# Patient Record
Sex: Female | Born: 1950 | Race: White | Hispanic: No | Smoking: Never smoker
Health system: Southern US, Community
[De-identification: ages and names within clinical notes are randomized; demographics above are authoritative.]

## PROBLEM LIST (undated history)

## (undated) DIAGNOSIS — E785 Hyperlipidemia, unspecified: Secondary | ICD-10-CM

## (undated) DIAGNOSIS — I10 Essential (primary) hypertension: Secondary | ICD-10-CM

## (undated) DIAGNOSIS — E079 Disorder of thyroid, unspecified: Secondary | ICD-10-CM

## (undated) DIAGNOSIS — T7840XA Allergy, unspecified, initial encounter: Secondary | ICD-10-CM

## (undated) DIAGNOSIS — R112 Nausea with vomiting, unspecified: Secondary | ICD-10-CM

## (undated) DIAGNOSIS — K219 Gastro-esophageal reflux disease without esophagitis: Secondary | ICD-10-CM

## (undated) DIAGNOSIS — Z9889 Other specified postprocedural states: Secondary | ICD-10-CM

## (undated) DIAGNOSIS — K589 Irritable bowel syndrome without diarrhea: Secondary | ICD-10-CM

## (undated) DIAGNOSIS — G4730 Sleep apnea, unspecified: Secondary | ICD-10-CM

## (undated) DIAGNOSIS — C801 Malignant (primary) neoplasm, unspecified: Secondary | ICD-10-CM

## (undated) DIAGNOSIS — H269 Unspecified cataract: Secondary | ICD-10-CM

## (undated) DIAGNOSIS — K5730 Diverticulosis of large intestine without perforation or abscess without bleeding: Secondary | ICD-10-CM

## (undated) HISTORY — DX: Irritable bowel syndrome, unspecified: K58.9

## (undated) HISTORY — PX: UPPER GASTROINTESTINAL ENDOSCOPY: SHX188

## (undated) HISTORY — DX: Hyperlipidemia, unspecified: E78.5

## (undated) HISTORY — DX: Malignant (primary) neoplasm, unspecified: C80.1

## (undated) HISTORY — PX: CHOLECYSTECTOMY: SHX55

## (undated) HISTORY — PX: VAGINAL HYSTERECTOMY: SUR661

## (undated) HISTORY — DX: Nausea with vomiting, unspecified: Z98.890

## (undated) HISTORY — DX: Sleep apnea, unspecified: G47.30

## (undated) HISTORY — PX: POLYPECTOMY: SHX149

## (undated) HISTORY — DX: Diverticulosis of large intestine without perforation or abscess without bleeding: K57.30

## (undated) HISTORY — DX: Gastro-esophageal reflux disease without esophagitis: K21.9

## (undated) HISTORY — DX: Unspecified cataract: H26.9

## (undated) HISTORY — PX: COLONOSCOPY: SHX174

## (undated) HISTORY — DX: Disorder of thyroid, unspecified: E07.9

## (undated) HISTORY — DX: Allergy, unspecified, initial encounter: T78.40XA

## (undated) HISTORY — DX: Essential (primary) hypertension: I10

## (undated) HISTORY — DX: Other specified postprocedural states: R11.2

---

## 2015-11-28 DIAGNOSIS — G8929 Other chronic pain: Secondary | ICD-10-CM | POA: Diagnosis not present

## 2015-11-28 DIAGNOSIS — H6120 Impacted cerumen, unspecified ear: Secondary | ICD-10-CM | POA: Diagnosis not present

## 2015-11-28 DIAGNOSIS — M25512 Pain in left shoulder: Secondary | ICD-10-CM | POA: Diagnosis not present

## 2016-03-09 DIAGNOSIS — Z79899 Other long term (current) drug therapy: Secondary | ICD-10-CM | POA: Diagnosis not present

## 2016-03-09 DIAGNOSIS — Z23 Encounter for immunization: Secondary | ICD-10-CM | POA: Diagnosis not present

## 2016-03-09 DIAGNOSIS — E782 Mixed hyperlipidemia: Secondary | ICD-10-CM | POA: Diagnosis not present

## 2016-03-09 DIAGNOSIS — Z0000 Encounter for general adult medical examination without abnormal findings: Secondary | ICD-10-CM | POA: Diagnosis not present

## 2016-03-16 DIAGNOSIS — Z1211 Encounter for screening for malignant neoplasm of colon: Secondary | ICD-10-CM | POA: Diagnosis not present

## 2016-03-19 DIAGNOSIS — R748 Abnormal levels of other serum enzymes: Secondary | ICD-10-CM | POA: Diagnosis not present

## 2016-03-19 DIAGNOSIS — Z1231 Encounter for screening mammogram for malignant neoplasm of breast: Secondary | ICD-10-CM | POA: Diagnosis not present

## 2016-03-25 DIAGNOSIS — R748 Abnormal levels of other serum enzymes: Secondary | ICD-10-CM | POA: Diagnosis not present

## 2016-03-26 ENCOUNTER — Other Ambulatory Visit: Payer: Self-pay | Admitting: Family Medicine

## 2016-03-26 DIAGNOSIS — R928 Other abnormal and inconclusive findings on diagnostic imaging of breast: Secondary | ICD-10-CM | POA: Diagnosis not present

## 2016-03-26 DIAGNOSIS — N6489 Other specified disorders of breast: Secondary | ICD-10-CM

## 2016-03-27 DIAGNOSIS — G4733 Obstructive sleep apnea (adult) (pediatric): Secondary | ICD-10-CM | POA: Diagnosis not present

## 2016-04-02 ENCOUNTER — Other Ambulatory Visit: Payer: Self-pay | Admitting: Family Medicine

## 2016-04-02 DIAGNOSIS — N6489 Other specified disorders of breast: Secondary | ICD-10-CM

## 2016-04-03 ENCOUNTER — Ambulatory Visit
Admission: RE | Admit: 2016-04-03 | Discharge: 2016-04-03 | Disposition: A | Discharge status: Home / Self Care | Payer: PPO | Source: Ambulatory Visit | Attending: Family Medicine | Admitting: Family Medicine

## 2016-04-03 DIAGNOSIS — N6489 Other specified disorders of breast: Secondary | ICD-10-CM

## 2016-04-03 DIAGNOSIS — R922 Inconclusive mammogram: Secondary | ICD-10-CM | POA: Diagnosis not present

## 2016-07-30 DIAGNOSIS — Z23 Encounter for immunization: Secondary | ICD-10-CM | POA: Diagnosis not present

## 2016-09-03 DIAGNOSIS — H25811 Combined forms of age-related cataract, right eye: Secondary | ICD-10-CM | POA: Diagnosis not present

## 2016-09-03 DIAGNOSIS — H524 Presbyopia: Secondary | ICD-10-CM | POA: Diagnosis not present

## 2016-09-24 DIAGNOSIS — Z1389 Encounter for screening for other disorder: Secondary | ICD-10-CM | POA: Diagnosis not present

## 2016-09-24 DIAGNOSIS — Z9181 History of falling: Secondary | ICD-10-CM | POA: Diagnosis not present

## 2016-09-24 DIAGNOSIS — I10 Essential (primary) hypertension: Secondary | ICD-10-CM | POA: Diagnosis not present

## 2016-10-23 DIAGNOSIS — I10 Essential (primary) hypertension: Secondary | ICD-10-CM | POA: Diagnosis not present

## 2016-12-15 DIAGNOSIS — H25041 Posterior subcapsular polar age-related cataract, right eye: Secondary | ICD-10-CM | POA: Diagnosis not present

## 2016-12-15 DIAGNOSIS — H2511 Age-related nuclear cataract, right eye: Secondary | ICD-10-CM | POA: Diagnosis not present

## 2016-12-15 DIAGNOSIS — H2513 Age-related nuclear cataract, bilateral: Secondary | ICD-10-CM | POA: Diagnosis not present

## 2016-12-15 DIAGNOSIS — H25012 Cortical age-related cataract, left eye: Secondary | ICD-10-CM | POA: Diagnosis not present

## 2016-12-15 DIAGNOSIS — H25011 Cortical age-related cataract, right eye: Secondary | ICD-10-CM | POA: Diagnosis not present

## 2017-02-15 DIAGNOSIS — H25811 Combined forms of age-related cataract, right eye: Secondary | ICD-10-CM | POA: Diagnosis not present

## 2017-02-15 DIAGNOSIS — H2511 Age-related nuclear cataract, right eye: Secondary | ICD-10-CM | POA: Diagnosis not present

## 2017-02-16 DIAGNOSIS — H2512 Age-related nuclear cataract, left eye: Secondary | ICD-10-CM | POA: Diagnosis not present

## 2017-02-23 ENCOUNTER — Other Ambulatory Visit: Payer: Self-pay | Admitting: Family Medicine

## 2017-02-23 DIAGNOSIS — Z1231 Encounter for screening mammogram for malignant neoplasm of breast: Secondary | ICD-10-CM

## 2017-03-08 DIAGNOSIS — H25812 Combined forms of age-related cataract, left eye: Secondary | ICD-10-CM | POA: Diagnosis not present

## 2017-03-08 DIAGNOSIS — H2512 Age-related nuclear cataract, left eye: Secondary | ICD-10-CM | POA: Diagnosis not present

## 2017-03-11 DIAGNOSIS — Z6823 Body mass index (BMI) 23.0-23.9, adult: Secondary | ICD-10-CM | POA: Diagnosis not present

## 2017-03-11 DIAGNOSIS — I10 Essential (primary) hypertension: Secondary | ICD-10-CM | POA: Diagnosis not present

## 2017-03-11 DIAGNOSIS — Z23 Encounter for immunization: Secondary | ICD-10-CM | POA: Diagnosis not present

## 2017-03-11 DIAGNOSIS — E782 Mixed hyperlipidemia: Secondary | ICD-10-CM | POA: Diagnosis not present

## 2017-03-11 DIAGNOSIS — Z0000 Encounter for general adult medical examination without abnormal findings: Secondary | ICD-10-CM | POA: Diagnosis not present

## 2017-03-11 DIAGNOSIS — S21209A Unspecified open wound of unspecified back wall of thorax without penetration into thoracic cavity, initial encounter: Secondary | ICD-10-CM | POA: Diagnosis not present

## 2017-03-11 DIAGNOSIS — Z79899 Other long term (current) drug therapy: Secondary | ICD-10-CM | POA: Diagnosis not present

## 2017-03-11 DIAGNOSIS — R748 Abnormal levels of other serum enzymes: Secondary | ICD-10-CM | POA: Diagnosis not present

## 2017-03-11 DIAGNOSIS — E039 Hypothyroidism, unspecified: Secondary | ICD-10-CM | POA: Diagnosis not present

## 2017-03-11 DIAGNOSIS — Z760 Encounter for issue of repeat prescription: Secondary | ICD-10-CM | POA: Diagnosis not present

## 2017-03-12 DIAGNOSIS — Z79899 Other long term (current) drug therapy: Secondary | ICD-10-CM | POA: Diagnosis not present

## 2017-03-24 DIAGNOSIS — Z1211 Encounter for screening for malignant neoplasm of colon: Secondary | ICD-10-CM | POA: Diagnosis not present

## 2017-03-26 ENCOUNTER — Ambulatory Visit
Admission: RE | Admit: 2017-03-26 | Discharge: 2017-03-26 | Disposition: A | Discharge status: Home / Self Care | Payer: PPO | Source: Ambulatory Visit | Attending: Family Medicine | Admitting: Family Medicine

## 2017-03-26 DIAGNOSIS — Z1231 Encounter for screening mammogram for malignant neoplasm of breast: Secondary | ICD-10-CM

## 2017-04-06 DIAGNOSIS — Z961 Presence of intraocular lens: Secondary | ICD-10-CM | POA: Diagnosis not present

## 2017-07-15 DIAGNOSIS — Z23 Encounter for immunization: Secondary | ICD-10-CM | POA: Diagnosis not present

## 2017-07-29 DIAGNOSIS — L570 Actinic keratosis: Secondary | ICD-10-CM | POA: Diagnosis not present

## 2017-08-24 DIAGNOSIS — M272 Inflammatory conditions of jaws: Secondary | ICD-10-CM | POA: Diagnosis not present

## 2017-08-24 DIAGNOSIS — K045 Chronic apical periodontitis: Secondary | ICD-10-CM | POA: Diagnosis not present

## 2017-09-14 DIAGNOSIS — K045 Chronic apical periodontitis: Secondary | ICD-10-CM | POA: Diagnosis not present

## 2017-09-14 DIAGNOSIS — M272 Inflammatory conditions of jaws: Secondary | ICD-10-CM | POA: Diagnosis not present

## 2017-10-12 DIAGNOSIS — K529 Noninfective gastroenteritis and colitis, unspecified: Secondary | ICD-10-CM | POA: Diagnosis not present

## 2017-10-12 DIAGNOSIS — Z6824 Body mass index (BMI) 24.0-24.9, adult: Secondary | ICD-10-CM | POA: Diagnosis not present

## 2017-10-13 DIAGNOSIS — K529 Noninfective gastroenteritis and colitis, unspecified: Secondary | ICD-10-CM | POA: Diagnosis not present

## 2018-01-27 DIAGNOSIS — L719 Rosacea, unspecified: Secondary | ICD-10-CM | POA: Diagnosis not present

## 2018-01-27 DIAGNOSIS — Z6824 Body mass index (BMI) 24.0-24.9, adult: Secondary | ICD-10-CM | POA: Diagnosis not present

## 2018-02-17 ENCOUNTER — Other Ambulatory Visit: Payer: Self-pay | Admitting: Family Medicine

## 2018-02-17 DIAGNOSIS — Z1231 Encounter for screening mammogram for malignant neoplasm of breast: Secondary | ICD-10-CM

## 2018-03-17 DIAGNOSIS — E039 Hypothyroidism, unspecified: Secondary | ICD-10-CM | POA: Diagnosis not present

## 2018-03-17 DIAGNOSIS — Z1331 Encounter for screening for depression: Secondary | ICD-10-CM | POA: Diagnosis not present

## 2018-03-17 DIAGNOSIS — Z9181 History of falling: Secondary | ICD-10-CM | POA: Diagnosis not present

## 2018-03-17 DIAGNOSIS — Z0000 Encounter for general adult medical examination without abnormal findings: Secondary | ICD-10-CM | POA: Diagnosis not present

## 2018-03-17 DIAGNOSIS — Z13220 Encounter for screening for lipoid disorders: Secondary | ICD-10-CM | POA: Diagnosis not present

## 2018-03-17 DIAGNOSIS — Z6824 Body mass index (BMI) 24.0-24.9, adult: Secondary | ICD-10-CM | POA: Diagnosis not present

## 2018-03-17 DIAGNOSIS — G4733 Obstructive sleep apnea (adult) (pediatric): Secondary | ICD-10-CM | POA: Diagnosis not present

## 2018-03-31 ENCOUNTER — Ambulatory Visit
Admission: RE | Admit: 2018-03-31 | Discharge: 2018-03-31 | Disposition: A | Discharge status: Home / Self Care | Payer: PPO | Source: Ambulatory Visit | Attending: Family Medicine | Admitting: Family Medicine

## 2018-03-31 DIAGNOSIS — Z1231 Encounter for screening mammogram for malignant neoplasm of breast: Secondary | ICD-10-CM | POA: Diagnosis not present

## 2018-04-29 DIAGNOSIS — G4733 Obstructive sleep apnea (adult) (pediatric): Secondary | ICD-10-CM | POA: Diagnosis not present

## 2018-05-30 DIAGNOSIS — G4733 Obstructive sleep apnea (adult) (pediatric): Secondary | ICD-10-CM | POA: Diagnosis not present

## 2018-06-30 DIAGNOSIS — G4733 Obstructive sleep apnea (adult) (pediatric): Secondary | ICD-10-CM | POA: Diagnosis not present

## 2018-07-14 DIAGNOSIS — Z23 Encounter for immunization: Secondary | ICD-10-CM | POA: Diagnosis not present

## 2018-07-21 DIAGNOSIS — Z6824 Body mass index (BMI) 24.0-24.9, adult: Secondary | ICD-10-CM | POA: Diagnosis not present

## 2018-07-21 DIAGNOSIS — M25552 Pain in left hip: Secondary | ICD-10-CM | POA: Diagnosis not present

## 2018-07-21 DIAGNOSIS — Z1339 Encounter for screening examination for other mental health and behavioral disorders: Secondary | ICD-10-CM | POA: Diagnosis not present

## 2018-07-30 DIAGNOSIS — G4733 Obstructive sleep apnea (adult) (pediatric): Secondary | ICD-10-CM | POA: Diagnosis not present

## 2018-08-10 DIAGNOSIS — H6120 Impacted cerumen, unspecified ear: Secondary | ICD-10-CM | POA: Diagnosis not present

## 2018-08-10 DIAGNOSIS — J019 Acute sinusitis, unspecified: Secondary | ICD-10-CM | POA: Diagnosis not present

## 2018-08-30 DIAGNOSIS — G4733 Obstructive sleep apnea (adult) (pediatric): Secondary | ICD-10-CM | POA: Diagnosis not present

## 2018-10-30 DIAGNOSIS — G4733 Obstructive sleep apnea (adult) (pediatric): Secondary | ICD-10-CM | POA: Diagnosis not present

## 2018-11-30 DIAGNOSIS — G4733 Obstructive sleep apnea (adult) (pediatric): Secondary | ICD-10-CM | POA: Diagnosis not present

## 2018-12-29 DIAGNOSIS — G4733 Obstructive sleep apnea (adult) (pediatric): Secondary | ICD-10-CM | POA: Diagnosis not present

## 2019-01-29 DIAGNOSIS — G4733 Obstructive sleep apnea (adult) (pediatric): Secondary | ICD-10-CM | POA: Diagnosis not present

## 2019-02-28 ENCOUNTER — Other Ambulatory Visit: Payer: Self-pay | Admitting: Family Medicine

## 2019-02-28 DIAGNOSIS — Z1231 Encounter for screening mammogram for malignant neoplasm of breast: Secondary | ICD-10-CM

## 2019-02-28 DIAGNOSIS — G4733 Obstructive sleep apnea (adult) (pediatric): Secondary | ICD-10-CM | POA: Diagnosis not present

## 2019-03-23 DIAGNOSIS — Z79899 Other long term (current) drug therapy: Secondary | ICD-10-CM | POA: Diagnosis not present

## 2019-03-23 DIAGNOSIS — Z9181 History of falling: Secondary | ICD-10-CM | POA: Diagnosis not present

## 2019-03-23 DIAGNOSIS — E039 Hypothyroidism, unspecified: Secondary | ICD-10-CM | POA: Diagnosis not present

## 2019-03-23 DIAGNOSIS — Z6824 Body mass index (BMI) 24.0-24.9, adult: Secondary | ICD-10-CM | POA: Diagnosis not present

## 2019-03-23 DIAGNOSIS — Z0000 Encounter for general adult medical examination without abnormal findings: Secondary | ICD-10-CM | POA: Diagnosis not present

## 2019-03-23 DIAGNOSIS — Z1331 Encounter for screening for depression: Secondary | ICD-10-CM | POA: Diagnosis not present

## 2019-03-23 DIAGNOSIS — K589 Irritable bowel syndrome without diarrhea: Secondary | ICD-10-CM | POA: Diagnosis not present

## 2019-03-23 DIAGNOSIS — E782 Mixed hyperlipidemia: Secondary | ICD-10-CM | POA: Diagnosis not present

## 2019-03-23 DIAGNOSIS — I10 Essential (primary) hypertension: Secondary | ICD-10-CM | POA: Diagnosis not present

## 2019-03-31 DIAGNOSIS — G4733 Obstructive sleep apnea (adult) (pediatric): Secondary | ICD-10-CM | POA: Diagnosis not present

## 2019-04-27 ENCOUNTER — Other Ambulatory Visit: Payer: Self-pay

## 2019-04-27 ENCOUNTER — Ambulatory Visit
Admission: RE | Admit: 2019-04-27 | Discharge: 2019-04-27 | Disposition: A | Discharge status: Home / Self Care | Payer: PPO | Source: Ambulatory Visit | Attending: Family Medicine | Admitting: Family Medicine

## 2019-04-27 DIAGNOSIS — Z1231 Encounter for screening mammogram for malignant neoplasm of breast: Secondary | ICD-10-CM

## 2019-04-30 DIAGNOSIS — G4733 Obstructive sleep apnea (adult) (pediatric): Secondary | ICD-10-CM | POA: Diagnosis not present

## 2019-05-31 DIAGNOSIS — G4733 Obstructive sleep apnea (adult) (pediatric): Secondary | ICD-10-CM | POA: Diagnosis not present

## 2019-07-01 DIAGNOSIS — G4733 Obstructive sleep apnea (adult) (pediatric): Secondary | ICD-10-CM | POA: Diagnosis not present

## 2019-07-07 DIAGNOSIS — I10 Essential (primary) hypertension: Secondary | ICD-10-CM | POA: Diagnosis not present

## 2019-07-07 DIAGNOSIS — Z6824 Body mass index (BMI) 24.0-24.9, adult: Secondary | ICD-10-CM | POA: Diagnosis not present

## 2019-07-07 DIAGNOSIS — C4492 Squamous cell carcinoma of skin, unspecified: Secondary | ICD-10-CM | POA: Diagnosis not present

## 2019-07-07 DIAGNOSIS — L089 Local infection of the skin and subcutaneous tissue, unspecified: Secondary | ICD-10-CM | POA: Diagnosis not present

## 2019-07-13 DIAGNOSIS — L578 Other skin changes due to chronic exposure to nonionizing radiation: Secondary | ICD-10-CM | POA: Diagnosis not present

## 2019-07-13 DIAGNOSIS — C44722 Squamous cell carcinoma of skin of right lower limb, including hip: Secondary | ICD-10-CM | POA: Diagnosis not present

## 2019-07-19 DIAGNOSIS — C44722 Squamous cell carcinoma of skin of right lower limb, including hip: Secondary | ICD-10-CM | POA: Diagnosis not present

## 2019-07-31 DIAGNOSIS — G4733 Obstructive sleep apnea (adult) (pediatric): Secondary | ICD-10-CM | POA: Diagnosis not present

## 2019-08-28 ENCOUNTER — Encounter: Payer: Self-pay | Admitting: Gastroenterology

## 2019-12-04 ENCOUNTER — Encounter: Payer: Self-pay | Admitting: Gastroenterology

## 2019-12-15 ENCOUNTER — Ambulatory Visit (AMBULATORY_SURGERY_CENTER): Payer: PPO | Admitting: *Deleted

## 2019-12-15 ENCOUNTER — Other Ambulatory Visit: Payer: Self-pay

## 2019-12-15 VITALS — Ht 62.0 in | Wt 116.0 lb

## 2019-12-15 DIAGNOSIS — Z86010 Personal history of colonic polyps: Secondary | ICD-10-CM

## 2019-12-15 NOTE — Progress Notes (Signed)
Pt verified name, DOB, address and insurance during PV today. Pt mailed instruction packet to included paper to complete and mail back to [facility] with addressed and stamped envelope, Emmi video, copy of consent form to read and not return, and instructions. PV completed over the phone. Pt encouraged to call with questions or issues  Pt has had 2 covid vaccines- last 2-6 so will be >2 weeks out for colon- no pre cov screen needed   No egg or soy allergy known to patient   issues with past sedation with any surgeries  or procedures of PONV , no intubation problems  No diet pills per patient No home 02 use per patient  No blood thinners per patient  Pt denies issues with constipation  No A fib or A flutter  EMMI video sent to pt's e mail   Due to the COVID-19 pandemic we are asking patients to follow these guidelines. Please only bring one care partner. Please be aware that your care partner may wait in the car in the parking lot or if they feel like they will be too hot to wait in the car, they may wait in the lobby on the 4th floor. All care partners are required to wear a mask the entire time (we do not have any that we can provide them), they need to practice social distancing, and we will do a Covid check for all patient's and care partners when you arrive. Also we will check their temperature and your temperature. If the care partner waits in their car they need to stay in the parking lot the entire time and we will call them on their cell phone when the patient is ready for discharge so they can bring the car to the front of the building. Also all patient's will need to wear a mask into building.

## 2019-12-25 ENCOUNTER — Encounter: Payer: Self-pay | Admitting: Gastroenterology

## 2019-12-28 ENCOUNTER — Ambulatory Visit (AMBULATORY_SURGERY_CENTER): Payer: PPO | Admitting: Gastroenterology

## 2019-12-28 ENCOUNTER — Encounter: Payer: Self-pay | Admitting: Gastroenterology

## 2019-12-28 ENCOUNTER — Other Ambulatory Visit: Payer: Self-pay

## 2019-12-28 VITALS — BP 116/64 | HR 69 | Temp 96.8°F | Resp 16 | Ht 62.0 in | Wt 116.0 lb

## 2019-12-28 DIAGNOSIS — K635 Polyp of colon: Secondary | ICD-10-CM

## 2019-12-28 DIAGNOSIS — D122 Benign neoplasm of ascending colon: Secondary | ICD-10-CM

## 2019-12-28 DIAGNOSIS — Z86010 Personal history of colonic polyps: Secondary | ICD-10-CM | POA: Diagnosis not present

## 2019-12-28 MED ORDER — SODIUM CHLORIDE 0.9 % IV SOLN: 500.0000 mL | Freq: Once | INTRAVENOUS | Status: DC

## 2019-12-28 NOTE — Op Note (Signed)
[place] Patient Name: Terrell Wobig Procedure Date: 12/28/2019 9:18 AM MRN: [id number] Endoscopist: Jackquline Denmark , MD Age: 69 Referring MD:  Date of Birth: Feb 19, 1951 Gender: Female Account #: [id number] Procedure:                Colonoscopy Indications:              High risk colon cancer surveillance: Personal                            history of colonic polyps Medicines:                Monitored Anesthesia Care Procedure:                Pre-Anesthesia Assessment:                           - Prior to the procedure, a History and Physical                            was performed, and patient medications and                            allergies were reviewed. The patient's tolerance of                            previous anesthesia was also reviewed. The risks                            and benefits of the procedure and the sedation                            options and risks were discussed with the patient.                            All questions were answered, and informed consent                            was obtained. Prior Anticoagulants: The patient has                            taken no previous anticoagulant or antiplatelet                            agents. ASA Grade Assessment: II - A patient with                            mild systemic disease. After reviewing the risks                            and benefits, the patient was deemed in                            satisfactory condition to undergo the procedure.  After obtaining informed consent, the colonoscope                            was passed under direct vision. Throughout the                            procedure, the patient's blood pressure, pulse, and                            oxygen saturations were monitored continuously. The                            Colonoscope was introduced through the anus and                            advanced to the 2 cm into the ileum. The                             colonoscopy was performed without difficulty. The                            patient tolerated the procedure well. The quality                            of the bowel preparation was good. The terminal                            ileum, ileocecal valve, appendiceal orifice, and                            rectum were photographed. Scope In: 9:23:28 AM Scope Out: 9:37:51 AM Scope Withdrawal Time: 0 hours 10 minutes 28 seconds  Total Procedure Duration: 0 hours 14 minutes 23 seconds  Findings:                 A 6 mm polyp was found in the proximal ascending                            colon. The polyp was sessile. The polyp was removed                            with a cold snare. Resection and retrieval were                            complete.                           Multiple small-mouthed diverticula were found in                            the sigmoid colon, descending colon and few rare in                            ascending colon.  Non-bleeding internal hemorrhoids and ext                            hemorrhoids were found during retroflexion and                            rectal exam. The hemorrhoids were moderate.                           The terminal ileum appeared normal.                           The exam was otherwise without abnormality on                            direct and retroflexion views. Complications:            No immediate complications. Estimated Blood Loss:     Estimated blood loss: none. Impression:               - One 6 mm polyp in the proximal ascending colon,                            removed with a cold snare. Resected and retrieved.                           - Moderate predominantly sigmoid diverticulosis.                           - Non-bleeding internal hemorrhoids.                           - Otherwise normal colonoscopy to TI. Recommendation:           - Patient has a contact number available for                             emergencies. The signs and symptoms of potential                            delayed complications were discussed with the                            patient. Return to normal activities tomorrow.                            Written discharge instructions were provided to the                            patient.                           - High fiber diet.                           - Continue present medications.                           -  Await pathology results.                           - Repeat colonoscopy in 7-10 years for surveillance                            based on pathology results.                           - Return to GI office PRN.                           - D/W Florina Ou, MD 12/28/2019 9:46:11 AM This report has been signed electronically.

## 2019-12-28 NOTE — Progress Notes (Signed)
Called to room to assist during endoscopic procedure.  Patient ID and intended procedure confirmed with present staff. Received instructions for my participation in the procedure from the performing physician.  

## 2019-12-28 NOTE — Patient Instructions (Signed)
 Handouts on polyps ,diverticulosis ,hemorrhoids, high fiber diet  given to you today  Await pathology results on polyp removed     YOU HAD AN ENDOSCOPIC PROCEDURE TODAY AT THE [facility]:   Refer to the procedure report that was given to you for any specific questions about what was found during the examination.  If the procedure report does not answer your questions, please call your gastroenterologist to clarify.  If you requested that your care partner not be given the details of your procedure findings, then the procedure report has been included in a sealed envelope for you to review at your convenience later.  YOU SHOULD EXPECT: Some feelings of bloating in the abdomen. Passage of more gas than usual.  Walking can help get rid of the air that was put into your GI tract during the procedure and reduce the bloating. If you had a lower endoscopy (such as a colonoscopy or flexible sigmoidoscopy) you may notice spotting of blood in your stool or on the toilet paper. If you underwent a bowel prep for your procedure, you may not have a normal bowel movement for a few days.  Please Note:  You might notice some irritation and congestion in your nose or some drainage.  This is from the oxygen used during your procedure.  There is no need for concern and it should clear up in a day or so.  SYMPTOMS TO REPORT IMMEDIATELY:   Following lower endoscopy (colonoscopy or flexible sigmoidoscopy):  Excessive amounts of blood in the stool  Significant tenderness or worsening of abdominal pains  Swelling of the abdomen that is new, acute  Fever of 100F or higher    For urgent or emergent issues, a gastroenterologist can be reached at any hour by calling [phone]. Do not use MyChart messaging for urgent concerns.    DIET:  We do recommend a small meal at first, but then you may proceed to your regular diet.  Drink plenty of fluids but you should avoid alcoholic beverages for 24  hours.  ACTIVITY:  You should plan to take it easy for the rest of today and you should NOT DRIVE or use heavy machinery until tomorrow (because of the sedation medicines used during the test).    FOLLOW UP: Our staff will call the number listed on your records 48-72 hours following your procedure to check on you and address any questions or concerns that you may have regarding the information given to you following your procedure. If we do not reach you, we will leave a message.  We will attempt to reach you two times.  During this call, we will ask if you have developed any symptoms of COVID 19. If you develop any symptoms (ie: fever, flu-like symptoms, shortness of breath, cough etc.) before then, please call [phone].  If you test positive for Covid 19 in the 2 weeks post procedure, please call and report this information to us.    If any biopsies were taken you will be contacted by phone or by letter within the next 1-3 weeks.  Please call us at [phone] if you have not heard about the biopsies in 3 weeks.    SIGNATURES/CONFIDENTIALITY: You and/or your care partner have signed paperwork which will be entered into your electronic medical record.  These signatures attest to the fact that that the information above on your After Visit Summary has been reviewed and is understood.  Full responsibility of the confidentiality of this   with you and/or your care-partner.

## 2019-12-28 NOTE — Progress Notes (Signed)
PT taken to PACU. Monitors in place. VSS. Report given to RN. 

## 2019-12-28 NOTE — Progress Notes (Signed)
Pt's states no medical or surgical changes since previsit or office visit.  Temp-JB  V/S-KA

## 2020-01-01 ENCOUNTER — Telehealth: Payer: Self-pay

## 2020-01-01 ENCOUNTER — Telehealth: Payer: Self-pay | Admitting: *Deleted

## 2020-01-01 NOTE — Telephone Encounter (Signed)
No answer for post procedure call back. Left message for patient to call back if needed and will attempt second call this afternoon.

## 2020-01-01 NOTE — Telephone Encounter (Signed)
2nd follow up call made.  NALM 

## 2020-01-04 DIAGNOSIS — Z131 Encounter for screening for diabetes mellitus: Secondary | ICD-10-CM | POA: Diagnosis not present

## 2020-01-04 DIAGNOSIS — Z79899 Other long term (current) drug therapy: Secondary | ICD-10-CM | POA: Diagnosis not present

## 2020-01-04 DIAGNOSIS — K589 Irritable bowel syndrome without diarrhea: Secondary | ICD-10-CM | POA: Diagnosis not present

## 2020-01-04 DIAGNOSIS — E039 Hypothyroidism, unspecified: Secondary | ICD-10-CM | POA: Diagnosis not present

## 2020-01-04 DIAGNOSIS — E782 Mixed hyperlipidemia: Secondary | ICD-10-CM | POA: Diagnosis not present

## 2020-01-04 DIAGNOSIS — M542 Cervicalgia: Secondary | ICD-10-CM | POA: Diagnosis not present

## 2020-01-04 DIAGNOSIS — Z6820 Body mass index (BMI) 20.0-20.9, adult: Secondary | ICD-10-CM | POA: Diagnosis not present

## 2020-01-07 ENCOUNTER — Encounter: Payer: Self-pay | Admitting: Gastroenterology

## 2020-03-11 DIAGNOSIS — B0239 Other herpes zoster eye disease: Secondary | ICD-10-CM | POA: Diagnosis not present

## 2020-03-11 DIAGNOSIS — Z6820 Body mass index (BMI) 20.0-20.9, adult: Secondary | ICD-10-CM | POA: Diagnosis not present

## 2020-03-18 ENCOUNTER — Other Ambulatory Visit: Payer: Self-pay | Admitting: Family Medicine

## 2020-03-18 DIAGNOSIS — Z1231 Encounter for screening mammogram for malignant neoplasm of breast: Secondary | ICD-10-CM

## 2020-03-26 DIAGNOSIS — Z6821 Body mass index (BMI) 21.0-21.9, adult: Secondary | ICD-10-CM | POA: Diagnosis not present

## 2020-03-26 DIAGNOSIS — H00019 Hordeolum externum unspecified eye, unspecified eyelid: Secondary | ICD-10-CM | POA: Diagnosis not present

## 2020-03-26 DIAGNOSIS — Z1331 Encounter for screening for depression: Secondary | ICD-10-CM | POA: Diagnosis not present

## 2020-03-26 DIAGNOSIS — H029 Unspecified disorder of eyelid: Secondary | ICD-10-CM | POA: Diagnosis not present

## 2020-03-26 DIAGNOSIS — Z9181 History of falling: Secondary | ICD-10-CM | POA: Diagnosis not present

## 2020-03-26 DIAGNOSIS — H8113 Benign paroxysmal vertigo, bilateral: Secondary | ICD-10-CM | POA: Diagnosis not present

## 2020-03-28 DIAGNOSIS — H00012 Hordeolum externum right lower eyelid: Secondary | ICD-10-CM | POA: Diagnosis not present

## 2020-04-11 DIAGNOSIS — K589 Irritable bowel syndrome without diarrhea: Secondary | ICD-10-CM | POA: Diagnosis not present

## 2020-04-11 DIAGNOSIS — E782 Mixed hyperlipidemia: Secondary | ICD-10-CM | POA: Diagnosis not present

## 2020-04-11 DIAGNOSIS — I10 Essential (primary) hypertension: Secondary | ICD-10-CM | POA: Diagnosis not present

## 2020-04-11 DIAGNOSIS — Z0000 Encounter for general adult medical examination without abnormal findings: Secondary | ICD-10-CM | POA: Diagnosis not present

## 2020-04-11 DIAGNOSIS — E039 Hypothyroidism, unspecified: Secondary | ICD-10-CM | POA: Diagnosis not present

## 2020-05-03 ENCOUNTER — Other Ambulatory Visit: Payer: Self-pay

## 2020-05-03 ENCOUNTER — Ambulatory Visit
Admission: RE | Admit: 2020-05-03 | Discharge: 2020-05-03 | Disposition: A | Discharge status: Home / Self Care | Payer: PPO | Source: Ambulatory Visit | Attending: Family Medicine | Admitting: Family Medicine

## 2020-05-03 DIAGNOSIS — Z1231 Encounter for screening mammogram for malignant neoplasm of breast: Secondary | ICD-10-CM | POA: Diagnosis not present

## 2020-11-05 DIAGNOSIS — Z961 Presence of intraocular lens: Secondary | ICD-10-CM | POA: Diagnosis not present

## 2020-11-05 DIAGNOSIS — H26491 Other secondary cataract, right eye: Secondary | ICD-10-CM | POA: Diagnosis not present

## 2020-11-05 DIAGNOSIS — H26493 Other secondary cataract, bilateral: Secondary | ICD-10-CM | POA: Diagnosis not present

## 2020-11-26 DIAGNOSIS — H26492 Other secondary cataract, left eye: Secondary | ICD-10-CM | POA: Diagnosis not present

## 2020-12-17 DIAGNOSIS — Z6822 Body mass index (BMI) 22.0-22.9, adult: Secondary | ICD-10-CM | POA: Diagnosis not present

## 2020-12-17 DIAGNOSIS — K5900 Constipation, unspecified: Secondary | ICD-10-CM | POA: Diagnosis not present

## 2020-12-17 DIAGNOSIS — R1013 Epigastric pain: Secondary | ICD-10-CM | POA: Diagnosis not present

## 2020-12-30 DIAGNOSIS — K5900 Constipation, unspecified: Secondary | ICD-10-CM | POA: Diagnosis not present

## 2020-12-30 DIAGNOSIS — R109 Unspecified abdominal pain: Secondary | ICD-10-CM | POA: Diagnosis not present

## 2020-12-30 DIAGNOSIS — R1013 Epigastric pain: Secondary | ICD-10-CM | POA: Diagnosis not present

## 2021-01-15 ENCOUNTER — Ambulatory Visit (INDEPENDENT_AMBULATORY_CARE_PROVIDER_SITE_OTHER): Payer: PPO | Admitting: Gastroenterology

## 2021-01-15 ENCOUNTER — Other Ambulatory Visit: Payer: Self-pay

## 2021-01-15 ENCOUNTER — Encounter: Payer: Self-pay | Admitting: Gastroenterology

## 2021-01-15 VITALS — BP 114/68 | HR 87 | Ht 62.0 in | Wt 125.1 lb

## 2021-01-15 DIAGNOSIS — R1013 Epigastric pain: Secondary | ICD-10-CM

## 2021-01-15 DIAGNOSIS — R1319 Other dysphagia: Secondary | ICD-10-CM

## 2021-01-15 MED ORDER — PANTOPRAZOLE SODIUM 40 MG PO TBEC: 40.0000 mg | DELAYED_RELEASE_TABLET | Freq: Every day | ORAL | 11 refills | Status: DC

## 2021-01-15 NOTE — Progress Notes (Addendum)
Chief Complaint: Epi pain.  Referring Provider:  Serita Grammes, MD      ASSESSMENT AND PLAN;   #1. Epi pain. S/P lap chole in past. Nl blood work and CT  #2. Intermittent dysphagia  Plan: -EGD with dil @ [facility] -Change omeprazole to protonix 40mg  po qd #30 -CT report, blood tests report from [facility].  I have discussed the risks and benefits of EGD. The risks including rare risk of perforation, bleeding, missed UGI neoplasms, risks of anesthesia/sedation. Alternatives were given. Patient is aware and agrees to proceed. All the questions were answered. This will be scheduled in upcoming days. Patient is to report immediately if there is any significant weight loss or excessive bleeding until then. Consent forms were given for review.  Addendum: CT Abdo/pelvis with contrast 12/30/2020 -Mild intrahepatic biliary ductal dilatation, CBD 7 mm.  Likely postcholecystectomy.  No choledocholithiasis -Sigmoid diverticulosis -Coronary calcification. -Otherwise unremarkable.  HPI:    Alexis Dunn is a 70 y.o. female   C/O postprandial epi pain with fullness since x January 2022-intermittent, associated with nausea and burps at night.  Note that she is s/p cholecystectomy in remote past.  Occasional dysphagia to pills.  Seen by Serita Grammes.  Per patient she had labs which were normal (including CBC, CMP and lipase).  Underwent CT Abdo[facility]pelvis at [facility] which was unremarkable (we do not have records of labs or CT at the present time-I tried logging into radiology system but Was Unable to get that).  Sent to GI clinic for further evaluations by means of EGD.  No significant heartburn. no definite food intolerance.  No problems with milk, cheese, gluten or any fried foods.  She does burp quite a bit at night.  She has been on omeprazole over last 10 years.  It is not helping.  No weight loss.  No melena or hematochezia.  She has occasional diarrhea after  laparoscopic cholecystectomy.  No recent travel.  Past GI procedures: Colonoscopy 12/2019 - Colonic polyp s/p polypectomy. - Moderate predominantly sigmoid diverticulosis. - Non-bleeding internal hemorrhoids. EGD 11/2007 -2 cm HH, irregular Z-line. -neg SB Bx, gastric and eso Bx Past Medical History:  Diagnosis Date  . Allergy   . Cancer ([place])    skin cancer  . Cataract    removed both eyes  . GERD (gastroesophageal reflux disease)   . Hyperlipidemia   . Hypertension   . IBS (irritable bowel syndrome)   . PONV (postoperative nausea and vomiting)   . Sleep apnea    wears cpap   . Thyroid disease     Past Surgical History:  Procedure Laterality Date  . CHOLECYSTECTOMY    . COLONOSCOPY    . POLYPECTOMY    . UPPER GASTROINTESTINAL ENDOSCOPY    . VAGINAL HYSTERECTOMY     left ovaries     Family History  Problem Relation Age of Onset  . Colon cancer Neg Hx   . Colon polyps Neg Hx   . Esophageal cancer Neg Hx   . Rectal cancer Neg Hx   . Stomach cancer Neg Hx     Social History   Tobacco Use  . Smoking status: Never Smoker  . Smokeless tobacco: Never Used  Vaping Use  . Vaping Use: Never used  Substance Use Topics  . Alcohol use: Yes    Comment: occ wine   . Drug use: Never    Current Outpatient Medications  Medication Sig Dispense Refill  . Ascorbic Acid (VITAMIN  C) 1000 MG tablet Take 1,000 mg by mouth daily.    . Calcium Carb-Cholecalciferol (CALCIUM 500+D PO) Take by mouth.    . cholecalciferol (VITAMIN D3) 25 MCG (1000 UNIT) tablet Take 1,000 Units by mouth daily.    . COLLAGEN PO Take by mouth.    . levothyroxine (SYNTHROID) 100 MCG tablet Take 100 mcg by mouth daily.    Marland Kitchen omeprazole (PRILOSEC) 40 MG capsule Take 40 mg by mouth daily.    . simvastatin (ZOCOR) 40 MG tablet Take 40 mg by mouth at bedtime.    Marland Kitchen telmisartan (MICARDIS) 80 MG tablet Take 80 mg by mouth daily.    . cholestyramine light (PREVALITE) 4 g packet Take 1 packet by mouth daily.  (Patient not taking: Reported on 01/15/2021)     No current facility-administered medications for this visit.    No Known Allergies  Review of Systems:  Constitutional: Denies fever, chills, diaphoresis, appetite change and fatigue.  HEENT: Denies photophobia, eye pain, redness, hearing loss, ear pain, congestion, sore throat, rhinorrhea, sneezing, mouth sores, neck pain, neck stiffness and tinnitus.   Respiratory: Denies SOB, DOE, cough, chest tightness,  and wheezing.   Cardiovascular: Denies chest pain, palpitations and leg swelling.  Genitourinary: Denies dysuria, urgency, frequency, hematuria, flank pain and difficulty urinating.  Musculoskeletal: Denies myalgias, back pain, joint swelling, arthralgias and gait problem.  Skin: No rash.  Neurological: Denies dizziness, seizures, syncope, weakness, light-headedness, numbness and headaches.  Hematological: Denies adenopathy. Easy bruising, personal or family bleeding history  Psychiatric/Behavioral: No anxiety or depression     Physical Exam:    BP 114/68   Pulse 87   Ht 5\' 2"  (1.575 m)   Wt 125 lb 2 oz (56.8 kg)   BMI 22.89 kg/m  Wt Readings from Last 3 Encounters:  01/15/21 125 lb 2 oz (56.8 kg)  12/28/19 116 lb (52.6 kg)  12/15/19 116 lb (52.6 kg)   Constitutional:  Well-developed, in no acute distress. Psychiatric: Normal mood and affect. Behavior is normal. HEENT: Pupils normal.  Conjunctivae are normal. No scleral icterus. Neck supple.  Cardiovascular: Normal rate, regular rhythm. No edema Pulmonary/chest: Effort normal and breath sounds normal. No wheezing, rales or rhonchi. Abdominal: Soft, nondistended. Nontender. Bowel sounds active throughout. There are no masses palpable. No hepatomegaly. Rectal: Deferred Neurological: Alert and oriented to person place and time. Skin: Skin is warm and dry. No rashes noted.      Carmell Austria, MD 01/15/2021, 9:50 AM  Cc: Serita Grammes, MD

## 2021-01-15 NOTE — Patient Instructions (Signed)
If you are age 71 or older, your body mass index should be between 23-30. Your Body mass index is 22.89 kg/m. If this is out of the aforementioned range listed, please consider follow up with your Primary Care Provider.  If you are age 42 or younger, your body mass index should be between 19-25. Your Body mass index is 22.89 kg/m. If this is out of the aformentioned range listed, please consider follow up with your Primary Care Provider.   You have been scheduled for an endoscopy. Please follow written instructions given to you at your visit today. If you use inhalers (even only as needed), please bring them with you on the day of your procedure.   We have sent the following medications to your pharmacy for you to pick up at your convenience: Protonix   Thank you,  Dr. Jackquline Denmark

## 2021-02-06 ENCOUNTER — Other Ambulatory Visit: Payer: Self-pay | Admitting: Gastroenterology

## 2021-02-06 ENCOUNTER — Other Ambulatory Visit (INDEPENDENT_AMBULATORY_CARE_PROVIDER_SITE_OTHER): Payer: PPO

## 2021-02-06 ENCOUNTER — Other Ambulatory Visit: Payer: Self-pay

## 2021-02-06 ENCOUNTER — Encounter: Payer: Self-pay | Admitting: Gastroenterology

## 2021-02-06 ENCOUNTER — Ambulatory Visit (AMBULATORY_SURGERY_CENTER): Payer: PPO | Admitting: Gastroenterology

## 2021-02-06 VITALS — BP 118/67 | HR 77 | Temp 97.1°F | Resp 18 | Ht 62.0 in | Wt 125.0 lb

## 2021-02-06 DIAGNOSIS — K222 Esophageal obstruction: Secondary | ICD-10-CM | POA: Diagnosis not present

## 2021-02-06 DIAGNOSIS — K219 Gastro-esophageal reflux disease without esophagitis: Secondary | ICD-10-CM | POA: Diagnosis not present

## 2021-02-06 DIAGNOSIS — R1013 Epigastric pain: Secondary | ICD-10-CM

## 2021-02-06 DIAGNOSIS — R1319 Other dysphagia: Secondary | ICD-10-CM | POA: Diagnosis not present

## 2021-02-06 DIAGNOSIS — K449 Diaphragmatic hernia without obstruction or gangrene: Secondary | ICD-10-CM | POA: Diagnosis not present

## 2021-02-06 DIAGNOSIS — K317 Polyp of stomach and duodenum: Secondary | ICD-10-CM

## 2021-02-06 DIAGNOSIS — K31A11 Gastric intestinal metaplasia without dysplasia, involving the antrum: Secondary | ICD-10-CM | POA: Diagnosis not present

## 2021-02-06 DIAGNOSIS — R1310 Dysphagia, unspecified: Secondary | ICD-10-CM | POA: Diagnosis not present

## 2021-02-06 DIAGNOSIS — K2970 Gastritis, unspecified, without bleeding: Secondary | ICD-10-CM

## 2021-02-06 LAB — COMPREHENSIVE METABOLIC PANEL
ALT: 15 U/L (ref 0–35)
AST: 16 U/L (ref 0–37)
Albumin: 4 g/dL (ref 3.5–5.2)
Alkaline Phosphatase: 78 U/L (ref 39–117)
BUN: 15 mg/dL (ref 6–23)
CO2: 27 mEq/L (ref 19–32)
Calcium: 9 mg/dL (ref 8.4–10.5)
Chloride: 108 mEq/L (ref 96–112)
Creatinine, Ser: 0.8 mg/dL (ref 0.40–1.20)
GFR: 74.39 mL/min (ref >60.00)
Glucose, Bld: 83 mg/dL (ref 70–99)
Potassium: 4.2 mEq/L (ref 3.5–5.1)
Sodium: 142 mEq/L (ref 135–145)
Total Bilirubin: 0.5 mg/dL (ref 0.2–1.2)
Total Protein: 6.7 g/dL (ref 6.0–8.3)

## 2021-02-06 LAB — CBC WITH DIFFERENTIAL/PLATELET
Basophils Absolute: 0 10*3/uL (ref 0.0–0.1)
Basophils Relative: 0.6 % (ref 0.0–3.0)
Eosinophils Absolute: 0.1 10*3/uL (ref 0.0–0.7)
Eosinophils Relative: 2 % (ref 0.0–5.0)
HCT: 34.4 % — ABNORMAL LOW (ref 36.0–46.0)
Hemoglobin: 11.7 g/dL — ABNORMAL LOW (ref 12.0–15.0)
Lymphocytes Relative: 31.1 % (ref 12.0–46.0)
Lymphs Abs: 1.9 10*3/uL (ref 0.7–4.0)
MCHC: 33.9 g/dL (ref 30.0–36.0)
MCV: 85.4 fl (ref 78.0–100.0)
Monocytes Absolute: 0.4 10*3/uL (ref 0.1–1.0)
Monocytes Relative: 6.8 % (ref 3.0–12.0)
Neutro Abs: 3.7 10*3/uL (ref 1.4–7.7)
Neutrophils Relative %: 59.5 % (ref 43.0–77.0)
Platelets: 241 10*3/uL (ref 150.0–400.0)
RBC: 4.02 Mil/uL (ref 3.87–5.11)
RDW: 13.8 % (ref 11.5–15.5)
WBC: 6.2 10*3/uL (ref 4.0–10.5)

## 2021-02-06 LAB — LIPASE: Lipase: 18 U/L (ref 11.0–59.0)

## 2021-02-06 MED ORDER — SODIUM CHLORIDE 0.9 % IV SOLN: 500.0000 mL | Freq: Once | INTRAVENOUS | Status: DC

## 2021-02-06 NOTE — Progress Notes (Signed)
VS-CW 

## 2021-02-06 NOTE — Progress Notes (Signed)
Called to room to assist during endoscopic procedure.  Patient ID and intended procedure confirmed with present staff. Received instructions for my participation in the procedure from the performing physician.  

## 2021-02-06 NOTE — Op Note (Signed)
[place] Patient Name: Alexis Dunn Procedure Date: 02/06/2021 9:44 AM MRN: [id number] Endoscopist: Jackquline Denmark , MD Age: 70 Referring MD:  Date of Birth: 31-Aug-1951 Gender: Female Account #: [id number] Procedure:                Upper GI endoscopy Indications:              Epigastric abdominal pain, Dysphagia Medicines:                Monitored Anesthesia Care Procedure:                Pre-Anesthesia Assessment:                           - Prior to the procedure, a History and Physical                            was performed, and patient medications and                            allergies were reviewed. The patient's tolerance of                            previous anesthesia was also reviewed. The risks                            and benefits of the procedure and the sedation                            options and risks were discussed with the patient.                            All questions were answered, and informed consent                            was obtained. Prior Anticoagulants: The patient has                            taken no previous anticoagulant or antiplatelet                            agents. ASA Grade Assessment: II - A patient with                            mild systemic disease. After reviewing the risks                            and benefits, the patient was deemed in                            satisfactory condition to undergo the procedure.                           After obtaining informed consent, the endoscope was  passed under direct vision. Throughout the                            procedure, the patient's blood pressure, pulse, and                            oxygen saturations were monitored continuously. The                            Endoscope was introduced through the mouth, and                            advanced to the second part of duodenum. The upper                            GI endoscopy was  accomplished without difficulty.                            The patient tolerated the procedure well. 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 Biopsies were taken                            with a cold forceps for histology from proximal,                            mid and distal esophagus.                           A moderate Schatzki ring was found at the                            gastroesophageal junction with a luminal diameter                            of appox 12 mm. The scope was withdrawn. Dilation                            was performed with a Maloney dilator with mild                            resistance at 50 Fr and 52 Fr.                           A 2 cm hiatal hernia was present.                           Localized mild inflammation characterized by  erythema was found in the gastric antrum. Biopsies                            were taken with a cold forceps for histology.                           A few 2 to 4 mm sessile polyps with no bleeding and                            no stigmata of recent bleeding were found in the                            gastric fundus and in the gastric body. 2 polyps                            were removed with a cold biopsy forceps. Resection                            and retrieval were complete.                           The examined duodenum was normal. Complications:            No immediate complications. Estimated Blood Loss:     Estimated blood loss: none. Impression:               - Moderate Schatzki ring. Dilated.                           - 2 cm hiatal hernia.                           - Mild gastritis. Biopsied.                           - A few gastric polyps. (Resected and retrieved x 2) Recommendation:           - Patient has a contact number available for                            emergencies.  The signs and symptoms of potential                            delayed complications were discussed with the                            patient. Return to normal activities tomorrow.                            Written discharge instructions were provided to the                            patient.                           -  Post dilatation diet.                           - Continue present medications.                           - Await pathology results.                           - The findings and recommendations were discussed                            with the patient's family.                           - Check CBC, CMP and lipase today. Jackquline Denmark, MD 02/06/2021 10:09:46 AM This report has been signed electronically.

## 2021-02-06 NOTE — Patient Instructions (Signed)
To lab from discharge. Nothing by mouth until 11:00 am , clear liquids until 12:00, soft foods for the rest of today.     YOU HAD AN ENDOSCOPIC PROCEDURE TODAY AT [facility]:   Refer to the procedure report that was given to you for any specific questions about what was found during the examination.  If the procedure report does not answer your questions, please call your gastroenterologist to clarify.  If you requested that your care partner not be given the details of your procedure findings, then the procedure report has been included in a sealed envelope for you to review at your convenience later.  YOU SHOULD EXPECT: Some feelings of bloating in the abdomen. Passage of more gas than usual.  Walking can help get rid of the air that was put into your GI tract during the procedure and reduce the bloating. If you had a lower endoscopy (such as a colonoscopy or flexible sigmoidoscopy) you may notice spotting of blood in your stool or on the toilet paper. If you underwent a bowel prep for your procedure, you may not have a normal bowel movement for a few days.  Please Note:  You might notice some irritation and congestion in your nose or some drainage.  This is from the oxygen used during your procedure.  There is no need for concern and it should clear up in a day or so.  SYMPTOMS TO REPORT IMMEDIATELY:    Following upper endoscopy (EGD)  Vomiting of blood or coffee ground material  New chest pain or pain under the shoulder blades  Painful or persistently difficult swallowing  New shortness of breath  Fever of 100F or higher  Black, tarry-looking stools  For urgent or emergent issues, a gastroenterologist can be reached at any hour by calling [phone]. Do not use MyChart messaging for urgent concerns.    DIET:  Nothing by mouth until 11:00, clear liquids until 12:00, soft foods for the rest of today. Tommorrow you may proceed to your regular diet.  Drink plenty of  fluids but you should avoid alcoholic beverages for 24 hours.  ACTIVITY:  You should plan to take it easy for the rest of today and you should NOT DRIVE or use heavy machinery until tomorrow (because of the sedation medicines used during the test).    FOLLOW UP: Our staff will call the number listed on your records 48-72 hours following your procedure to check on you and address any questions or concerns that you may have regarding the information given to you following your procedure. If we do not reach you, we will leave a message.  We will attempt to reach you two times.  During this call, we will ask if you have developed any symptoms of COVID 19. If you develop any symptoms (ie: fever, flu-like symptoms, shortness of breath, cough etc.) before then, please call [phone].  If you test positive for Covid 19 in the 2 weeks post procedure, please call and report this information to [place].    If any biopsies were taken you will be contacted by phone or by letter within the next 1-3 weeks.  Please call us at [phone] if you have not heard about the biopsies in 3 weeks.    SIGNATURES/CONFIDENTIALITY: You and/or your care partner have signed paperwork which will be entered into your electronic medical record.  These signatures attest to the fact that that the information above on your After Visit Summary has been  reviewed and is understood.  Full responsibility of the confidentiality of this discharge information lies with you and/or your care-partner. 

## 2021-02-06 NOTE — Progress Notes (Signed)
To PACU, VSS. Report to [email or web address] 

## 2021-02-11 ENCOUNTER — Telehealth: Payer: Self-pay | Admitting: *Deleted

## 2021-02-11 NOTE — Telephone Encounter (Signed)
Second follow up call made

## 2021-02-11 NOTE — Telephone Encounter (Signed)
Message left

## 2021-02-20 ENCOUNTER — Encounter: Payer: Self-pay | Admitting: Gastroenterology

## 2021-02-22 DIAGNOSIS — E782 Mixed hyperlipidemia: Secondary | ICD-10-CM | POA: Diagnosis not present

## 2021-02-22 DIAGNOSIS — E039 Hypothyroidism, unspecified: Secondary | ICD-10-CM | POA: Diagnosis not present

## 2021-02-22 DIAGNOSIS — I10 Essential (primary) hypertension: Secondary | ICD-10-CM | POA: Diagnosis not present

## 2021-03-13 IMAGING — MG DIGITAL SCREENING BILATERAL MAMMOGRAM WITH TOMO AND CAD
8 series · 9 of 24 positions shown · non-contrast
Comparison: Previous exam(s).

CLINICAL DATA: Screening.

EXAM:
DIGITAL SCREENING BILATERAL MAMMOGRAM WITH TOMO AND CAD

[L MLO synth-2D]
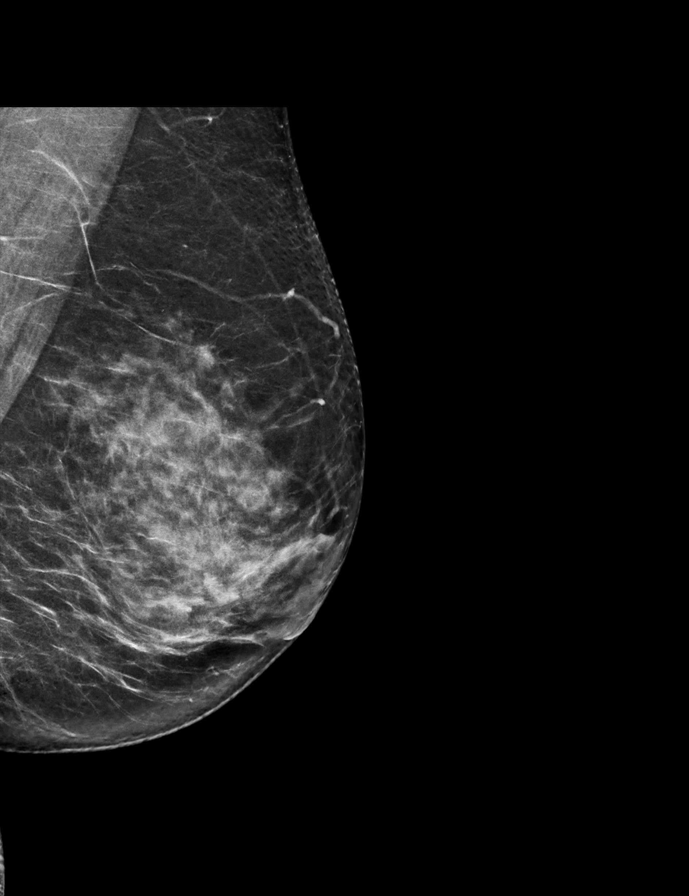

[R MLO synth-2D]
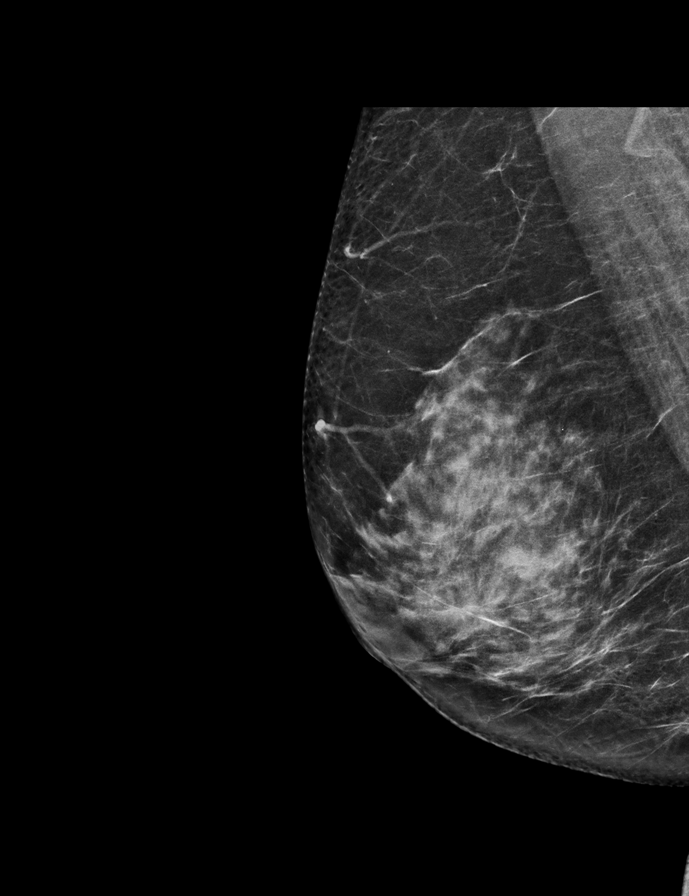

[L CC synth-2D]
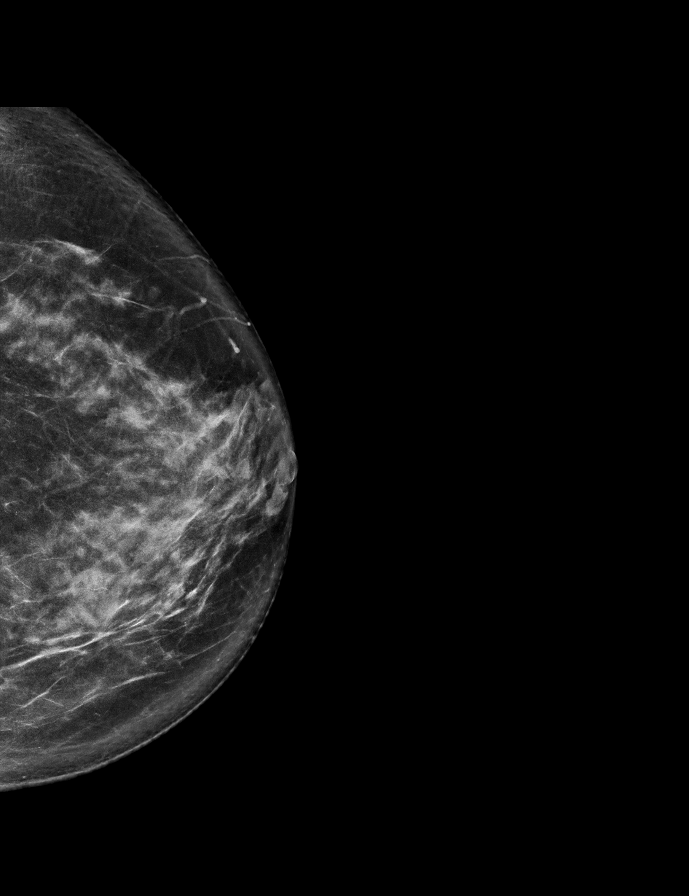

[R CC synth-2D]
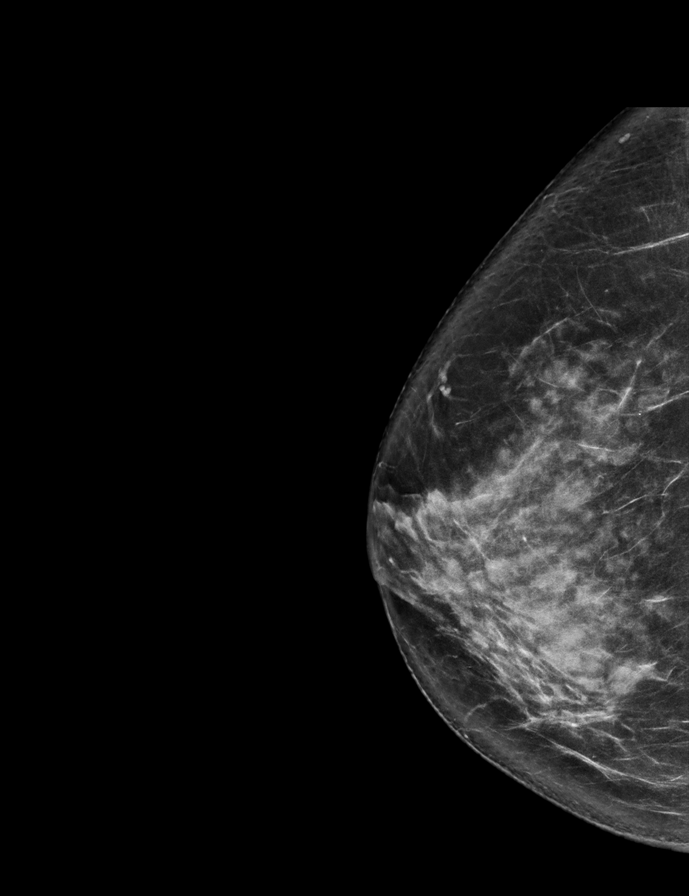

[R CC tomo · 2 of 74 frames shown]
[frame 24/74]
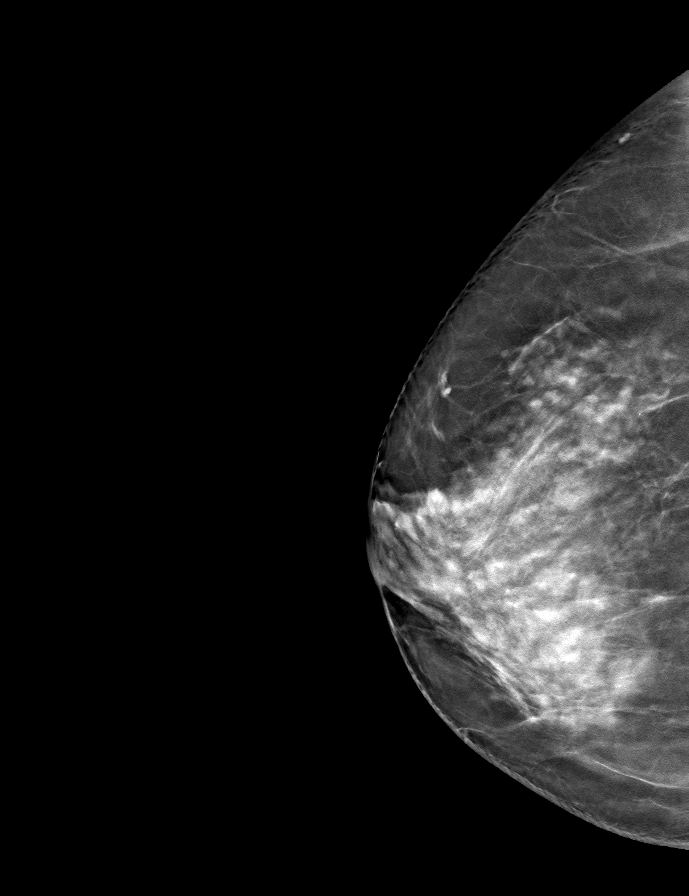
[frame 37/74]
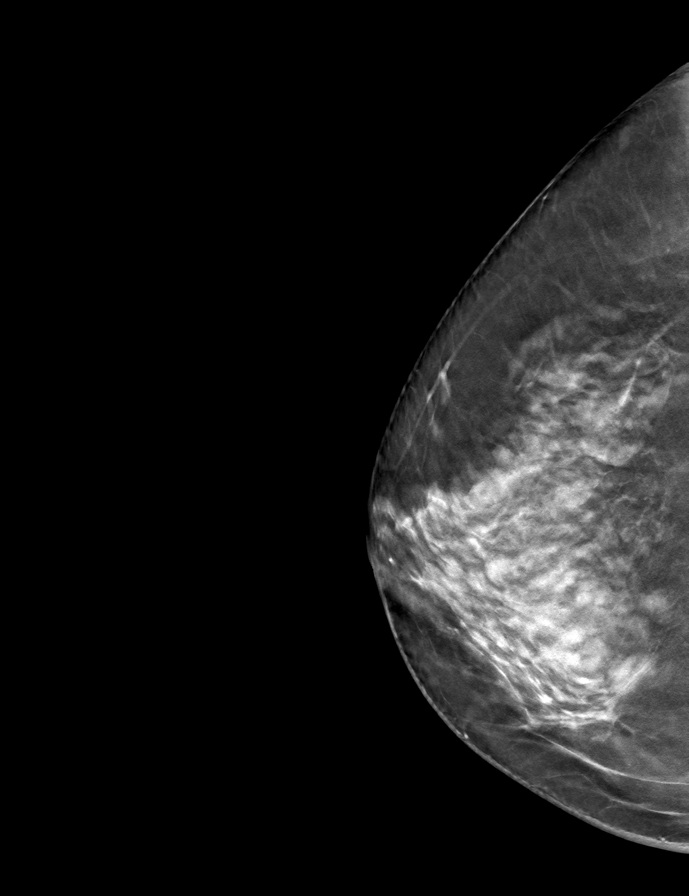

[L MLO tomo · tomo slice 37/73.0]
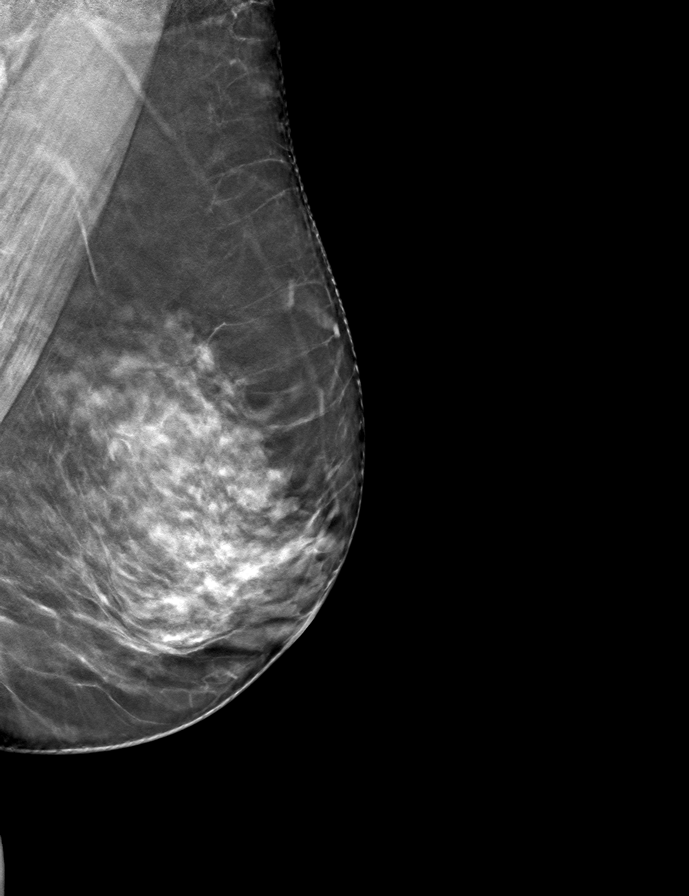

[L CC tomo · tomo slice 37/72.0]
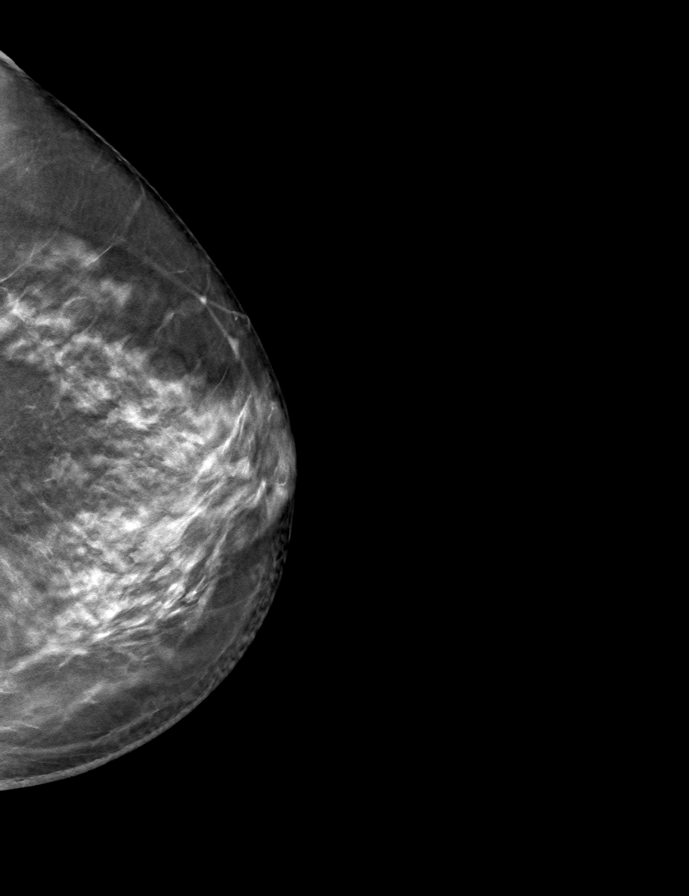

[R MLO tomo · tomo slice 37/73.0]
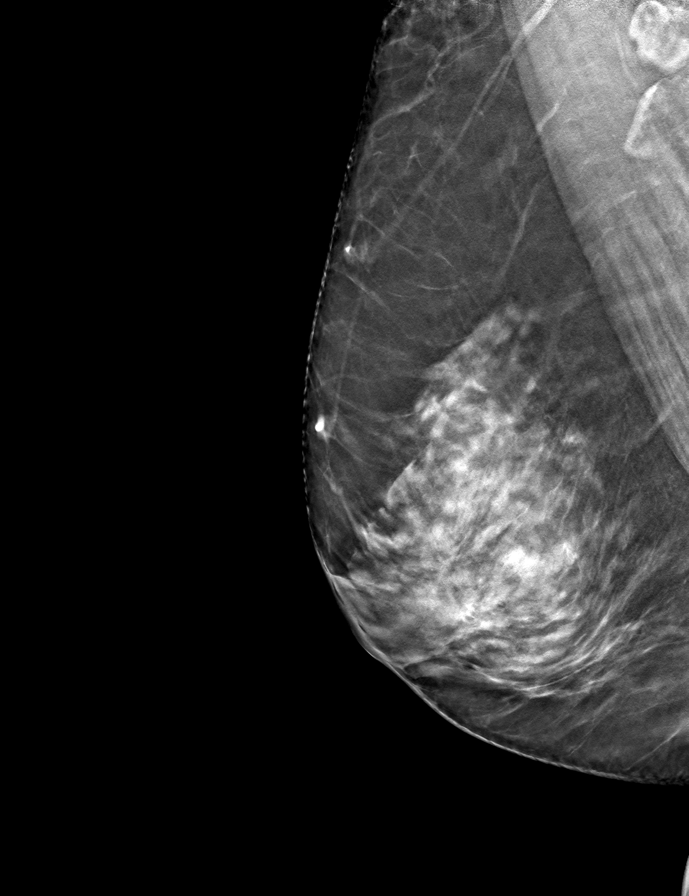

[9 of 24 positions shown; findings below may reference images not displayed]

ACR Breast Density Category c: The breast tissue is heterogeneously
dense, which may obscure small masses.
FINDINGS: There are no findings suspicious for malignancy. Images were
processed with CAD.
IMPRESSION: No mammographic evidence of malignancy. A result letter of this
screening mammogram will be mailed directly to the patient.

RECOMMENDATION:
Screening mammogram in one year. (Code:[id number])

BI-RADS CATEGORY  1: Negative.

## 2021-03-25 ENCOUNTER — Other Ambulatory Visit: Payer: Self-pay | Admitting: Family Medicine

## 2021-03-25 DIAGNOSIS — Z1231 Encounter for screening mammogram for malignant neoplasm of breast: Secondary | ICD-10-CM

## 2021-04-29 DIAGNOSIS — Z1331 Encounter for screening for depression: Secondary | ICD-10-CM | POA: Diagnosis not present

## 2021-04-29 DIAGNOSIS — Z79899 Other long term (current) drug therapy: Secondary | ICD-10-CM | POA: Diagnosis not present

## 2021-04-29 DIAGNOSIS — E782 Mixed hyperlipidemia: Secondary | ICD-10-CM | POA: Diagnosis not present

## 2021-04-29 DIAGNOSIS — E039 Hypothyroidism, unspecified: Secondary | ICD-10-CM | POA: Diagnosis not present

## 2021-04-29 DIAGNOSIS — G8929 Other chronic pain: Secondary | ICD-10-CM | POA: Diagnosis not present

## 2021-04-29 DIAGNOSIS — I10 Essential (primary) hypertension: Secondary | ICD-10-CM | POA: Diagnosis not present

## 2021-04-29 DIAGNOSIS — Z131 Encounter for screening for diabetes mellitus: Secondary | ICD-10-CM | POA: Diagnosis not present

## 2021-04-29 DIAGNOSIS — M549 Dorsalgia, unspecified: Secondary | ICD-10-CM | POA: Diagnosis not present

## 2021-04-29 DIAGNOSIS — Z6822 Body mass index (BMI) 22.0-22.9, adult: Secondary | ICD-10-CM | POA: Diagnosis not present

## 2021-04-29 DIAGNOSIS — K589 Irritable bowel syndrome without diarrhea: Secondary | ICD-10-CM | POA: Diagnosis not present

## 2021-04-29 DIAGNOSIS — Z0000 Encounter for general adult medical examination without abnormal findings: Secondary | ICD-10-CM | POA: Diagnosis not present

## 2021-05-20 ENCOUNTER — Other Ambulatory Visit: Payer: Self-pay

## 2021-05-20 ENCOUNTER — Ambulatory Visit
Admission: RE | Admit: 2021-05-20 | Discharge: 2021-05-20 | Disposition: A | Discharge status: Home / Self Care | Payer: PPO | Source: Ambulatory Visit | Attending: Family Medicine | Admitting: Family Medicine

## 2021-05-20 DIAGNOSIS — Z1231 Encounter for screening mammogram for malignant neoplasm of breast: Secondary | ICD-10-CM

## 2022-04-17 ENCOUNTER — Other Ambulatory Visit: Payer: Self-pay | Admitting: Family Medicine

## 2022-04-17 DIAGNOSIS — Z1231 Encounter for screening mammogram for malignant neoplasm of breast: Secondary | ICD-10-CM

## 2022-05-21 ENCOUNTER — Ambulatory Visit
Admission: RE | Admit: 2022-05-21 | Discharge: 2022-05-21 | Disposition: A | Discharge status: Home / Self Care | Payer: PPO | Source: Ambulatory Visit | Attending: Family Medicine | Admitting: Family Medicine

## 2022-05-21 DIAGNOSIS — Z1231 Encounter for screening mammogram for malignant neoplasm of breast: Secondary | ICD-10-CM

## 2022-07-03 ENCOUNTER — Telehealth: Payer: Self-pay | Admitting: Gastroenterology

## 2022-07-03 MED ORDER — PANTOPRAZOLE SODIUM 40 MG PO TBEC: 40.0000 mg | DELAYED_RELEASE_TABLET | Freq: Every day | ORAL | 5 refills | Status: DC

## 2022-07-03 NOTE — Telephone Encounter (Signed)
Pantoprazole refilled as patient requested.

## 2022-07-03 NOTE — Telephone Encounter (Signed)
Inbound call from patient stating she needs a refill for Protonix. Patient is requesting it be sent to [facility] on [address] in [place]. Please advise.

## 2022-10-01 ENCOUNTER — Other Ambulatory Visit: Payer: Self-pay | Admitting: Gastroenterology

## 2022-10-12 ENCOUNTER — Other Ambulatory Visit: Payer: Self-pay

## 2022-10-12 MED ORDER — PANTOPRAZOLE SODIUM 40 MG PO TBEC: 40.0000 mg | DELAYED_RELEASE_TABLET | Freq: Every day | ORAL | 2 refills | Status: DC

## 2023-01-06 ENCOUNTER — Other Ambulatory Visit: Payer: Self-pay | Admitting: Gastroenterology

## 2023-04-19 ENCOUNTER — Other Ambulatory Visit: Payer: Self-pay | Admitting: Family Medicine

## 2023-04-19 DIAGNOSIS — Z1231 Encounter for screening mammogram for malignant neoplasm of breast: Secondary | ICD-10-CM

## 2023-05-04 ENCOUNTER — Telehealth: Payer: Self-pay | Admitting: Gastroenterology

## 2023-05-04 ENCOUNTER — Other Ambulatory Visit: Payer: Self-pay | Admitting: Gastroenterology

## 2023-05-04 MED ORDER — PANTOPRAZOLE SODIUM 40 MG PO TBEC: 40.0000 mg | DELAYED_RELEASE_TABLET | Freq: Every day | ORAL | 0 refills | Status: DC

## 2023-05-04 NOTE — Telephone Encounter (Signed)
Done

## 2023-05-04 NOTE — Telephone Encounter (Signed)
Patient needing med refill on pantoprazole. Please advise.  Thank you

## 2023-05-31 ENCOUNTER — Ambulatory Visit
Admission: RE | Admit: 2023-05-31 | Discharge: 2023-05-31 | Disposition: A | Discharge status: Home / Self Care | Payer: PPO | Source: Ambulatory Visit | Attending: Family Medicine | Admitting: Family Medicine

## 2023-05-31 DIAGNOSIS — Z1231 Encounter for screening mammogram for malignant neoplasm of breast: Secondary | ICD-10-CM

## 2023-06-01 ENCOUNTER — Other Ambulatory Visit: Payer: Self-pay | Admitting: Gastroenterology

## 2023-06-04 ENCOUNTER — Telehealth: Payer: Self-pay | Admitting: Gastroenterology

## 2023-06-04 MED ORDER — PANTOPRAZOLE SODIUM 40 MG PO TBEC: 40.0000 mg | DELAYED_RELEASE_TABLET | Freq: Every day | ORAL | 3 refills | Status: DC

## 2023-06-04 NOTE — Telephone Encounter (Signed)
Inbound call from patient stating she needs a refill for pantoprazole medication. Please advise, thank you.

## 2023-06-04 NOTE — Telephone Encounter (Signed)
Refilled Pantoprazole. Patient scheduled appointment with Quentin Mulling, PA for 08/17/23

## 2023-08-16 NOTE — Progress Notes (Unsigned)
08/17/2023 EZTLI DESO [id number] [birth year]  Referring provider: Buckner Malta, MD Primary GI doctor: Dr. Chales Abrahams  ASSESSMENT AND PLAN:   Gastroesophageal Reflux Disease (GERD) Controlled on Pantoprazole. Discussed optimal timing of medication administration for maximum efficacy. -Continue Pantoprazole, consider taking 30-60 minutes before dinner for improved efficacy. -Refill Pantoprazole for 1 year.  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 Patient aware of dietary triggers. -Continue Cholestyramine as needed.  Diverticulosis No current symptoms. Last colonoscopy in 2021 showed diverticulosis. -Provide patient with information about diverticulosis.  Hemorrhoids No current symptoms. Last colonoscopy in 2021 showed hemorrhoids. -Monitor for any changes or symptoms.  Heart Murmur New finding on physical exam. No associated symptoms. Patient has family history of Mitral Valve Prolapse. -Advise patient to discuss with primary care provider if symptoms develop (swelling, shortness of breath, palpitations).  Follow-up in 1 year unless changes occur.    Patient Care Team: Buckner Malta, MD as PCP - General (Family Medicine)  HISTORY OF PRESENT ILLNESS: 72 y.o. female with a past medical history of hypertension, OSA, IBS, GERD, thyroid disease, hyperlipidemia, status post cholecystectomy and others listed below presents for evaluation of medication refill.   11/2007 EGD 2 cm HH irregular Z-line negative SB biopsy gastric and esophagus biopsy 12/2019 colonoscopy with colonic polyp polypectomy moderate predominantly sigmoid diverticulosis nonbleeding internal hemorrhoids 02/06/2021 EGD with Dr. Chales Abrahams for epigastric pain, dysphagia 2 cm hiatal hernia, moderate Schatzki ring dilated, mild gastritis, few gastric polyps retrieved and resected.  Path showed no infection, benign fundic gland polyps CBC showed  hemoglobin 11.7 MCV 85.5  Discussed the use of AI scribe software for clinical note transcription with the patient, who gave verbal consent to proceed.  The patient, with a history of diverticulosis, hemorrhoids, and gastroesophageal reflux disease (GERD), presents for a medication refill. She reports that her current regimen of cholesterol for IBS with diarrhea is effective 95% of the time, unless she deviates from her diet. She also takes pantoprazole (Protonix) once a day at bedtime for reflux. She reports a few days without the medication led to significant discomfort and difficulty sleeping.  The patient also reports occasional flare-ups of IBS-D, typically triggered by excessive greasy food or alcohol consumption. These episodes are characterized by a 'rolling' sensation in the abdomen but no bloody stools. She denies any recent blood in the stool or problems with hemorrhoids.  The patient's medications are also coordinated with her thyroid medication, which she takes first thing in the morning, followed by the cholesterol powder after an hour, and then she eats after another 30 minutes. She expresses a preference for taking the Protonix at night due to the complexity of her morning medication schedule.  The patient's last labs were checked in July by her primary care provider, and she reports no mention of anemia. She also denies any recent symptoms of shortness of breath or chest pain. She reports a family history of mitral valve prolapse in two brothers. She denies frequent use of NSAIDs such as Aleve, ibuprofen, or Goody powders.   She  reports that she has never smoked. She has never used smokeless tobacco. She reports current alcohol use. She reports that she does not use drugs.  RELEVANT LABS AND IMAGING:  Results   LABS Hb: 12.4 g/dL (4098)  DIAGNOSTIC Colonoscopy: Hemorrhoids and diverticulosis (2021)      CBC    Component Value Date/Time   WBC 6.2 02/06/2021 1033   RBC  4.02 02/06/2021 1033  HGB 11.7 (L) 02/06/2021 1033   HCT 34.4 (L) 02/06/2021 1033   PLT 241.0 02/06/2021 1033   MCV 85.4 02/06/2021 1033   MCHC 33.9 02/06/2021 1033   RDW 13.8 02/06/2021 1033   LYMPHSABS 1.9 02/06/2021 1033   MONOABS 0.4 02/06/2021 1033   EOSABS 0.1 02/06/2021 1033   BASOSABS 0.0 02/06/2021 1033   No results for input(s): "HGB" in the last 8760 hours.  CMP     Component Value Date/Time   NA 142 02/06/2021 1033   K 4.2 02/06/2021 1033   CL 108 02/06/2021 1033   CO2 27 02/06/2021 1033   GLUCOSE 83 02/06/2021 1033   BUN 15 02/06/2021 1033   CREATININE 0.80 02/06/2021 1033   CALCIUM 9.0 02/06/2021 1033   PROT 6.7 02/06/2021 1033   ALBUMIN 4.0 02/06/2021 1033   AST 16 02/06/2021 1033   ALT 15 02/06/2021 1033   ALKPHOS 78 02/06/2021 1033   BILITOT 0.5 02/06/2021 1033      Latest Ref Rng & Units 02/06/2021   10:33 AM  Hepatic Function  Total Protein 6.0 - 8.3 g/dL 6.7   Albumin 3.5 - 5.2 g/dL 4.0   AST 0 - 37 U/L 16   ALT 0 - 35 U/L 15   Alk Phosphatase 39 - 117 U/L 78   Total Bilirubin 0.2 - 1.2 mg/dL 0.5       Current Medications:   Current Outpatient Medications (Endocrine & Metabolic):    levothyroxine (SYNTHROID) 100 MCG tablet, Take 100 mcg by mouth daily.  Current Outpatient Medications (Cardiovascular):    cholestyramine light (PREVALITE) 4 GM/DOSE powder, Take 4 g by mouth daily.   simvastatin (ZOCOR) 40 MG tablet, Take 40 mg by mouth at bedtime.   telmisartan (MICARDIS) 80 MG tablet, Take 80 mg by mouth daily.     Current Outpatient Medications (Other):    Ascorbic Acid (VITAMIN C) 1000 MG tablet, Take 1,000 mg by mouth daily.   Calcium Carb-Cholecalciferol (CALCIUM 500+D PO), Take by mouth.   cholecalciferol (VITAMIN D3) 25 MCG (1000 UNIT) tablet, Take 1,000 Units by mouth daily.   pantoprazole (PROTONIX) 40 MG tablet, Take 1 tablet (40 mg total) by mouth daily. Call [phone] to schedule an office visit for more refills  Medical  History:  Past Medical History:  Diagnosis Date   Allergy    Cancer (HCC)    skin cancer   Cataract    removed both eyes   GERD (gastroesophageal reflux disease)    Hyperlipidemia    Hypertension    IBS (irritable bowel syndrome)    PONV (postoperative nausea and vomiting)    Sigmoid diverticulosis    On CT 12-30-2020   Sleep apnea    wears cpap    Thyroid disease    Allergies: No Known Allergies   Surgical History:  She  has a past surgical history that includes Colonoscopy; Polypectomy; Upper gastrointestinal endoscopy; Cholecystectomy; and Vaginal hysterectomy. Family History:  Her family history is not on file.  REVIEW OF SYSTEMS  : All other systems reviewed and negative except where noted in the History of Present Illness.  PHYSICAL EXAM: BP 118/68 (BP Location: Left Arm, Patient Position: Sitting, Cuff Size: Normal)   Pulse 68   Ht 5\' 1"  (1.549 m) Comment: height measured without shoes  Wt 133 lb 6 oz (60.5 kg)   BMI 25.20 kg/m  General Appearance: Well nourished, in no apparent distress. Head:   Normocephalic and atraumatic. Eyes:  sclerae anicteric,conjunctive pink  Respiratory:  Respiratory effort normal, BS equal bilaterally without rales, rhonchi, wheezing. Cardio: RRR with systolic murmur Peripheral pulses intact.  Abdomen: Soft,  Non-distended ,active bowel sounds. No tenderness . No masses. Rectal: Not evaluated Musculoskeletal: Full ROM, Normal gait. Without edema. Skin:  Dry and intact without significant lesions or rashes Neuro: Alert and  oriented x4;  No focal deficits. Psych:  Cooperative. Normal mood and affect.    Doree Albee, PA-C 9:27 AM

## 2023-08-17 ENCOUNTER — Encounter: Payer: Self-pay | Admitting: Physician Assistant

## 2023-08-17 ENCOUNTER — Ambulatory Visit: Payer: PPO | Admitting: Physician Assistant

## 2023-08-17 VITALS — BP 118/68 | HR 68 | Ht 61.0 in | Wt 133.4 lb

## 2023-08-17 DIAGNOSIS — K580 Irritable bowel syndrome with diarrhea: Secondary | ICD-10-CM | POA: Diagnosis not present

## 2023-08-17 DIAGNOSIS — K649 Unspecified hemorrhoids: Secondary | ICD-10-CM

## 2023-08-17 DIAGNOSIS — K9089 Other intestinal malabsorption: Secondary | ICD-10-CM | POA: Diagnosis not present

## 2023-08-17 DIAGNOSIS — K5730 Diverticulosis of large intestine without perforation or abscess without bleeding: Secondary | ICD-10-CM | POA: Diagnosis not present

## 2023-08-17 DIAGNOSIS — Z8249 Family history of ischemic heart disease and other diseases of the circulatory system: Secondary | ICD-10-CM

## 2023-08-17 DIAGNOSIS — K219 Gastro-esophageal reflux disease without esophagitis: Secondary | ICD-10-CM

## 2023-08-17 DIAGNOSIS — R011 Cardiac murmur, unspecified: Secondary | ICD-10-CM

## 2023-08-17 MED ORDER — PANTOPRAZOLE SODIUM 40 MG PO TBEC: 40.0000 mg | DELAYED_RELEASE_TABLET | Freq: Every day | ORAL | 3 refills | Status: DC

## 2023-08-17 NOTE — Patient Instructions (Addendum)
We will give you a call to get you scheduled for a one year follow up   Please take your proton pump inhibitor medication, protonix 40 mg  Please take this medication 30 minutes to 1 hour before meals- this makes it more effective.  Avoid spicy and acidic foods Avoid fatty foods Limit your intake of coffee, tea, alcohol, and carbonated drinks Work to maintain a healthy weight Keep the head of the bed elevated at least 3 inches with blocks or a wedge pillow if you are having any nighttime symptoms Stay upright for 2 hours after eating Avoid meals and snacks three to four hours before bedtime  Diverticulosis Diverticulosis is a condition that develops when small pouches (diverticula) form in the wall of the large intestine (colon). The colon is where water is absorbed and stool (feces) is formed. The pouches form when the inside layer of the colon pushes through weak spots in the outer layers of the colon. You may have a few pouches or many of them. The pouches usually do not cause problems unless they become inflamed or infected. When this happens, the condition is called diverticulitis- this is left lower quadrant pain, diarrhea, fever, chills, nausea or vomiting.  If this occurs please call the office or go to the hospital. Sometimes these patches without inflammation can also have painless bleeding associated with them, if this happens please call the office or go to the hospital. Preventing constipation and increasing fiber can help reduce diverticula and prevent complications. Even if you feel you have a high-fiber diet, suggest getting on Benefiber or Cirtracel 2 times daily.

## 2023-10-01 ENCOUNTER — Other Ambulatory Visit: Payer: Self-pay | Admitting: Gastroenterology

## 2023-12-31 DIAGNOSIS — L821 Other seborrheic keratosis: Secondary | ICD-10-CM | POA: Diagnosis not present

## 2023-12-31 DIAGNOSIS — B078 Other viral warts: Secondary | ICD-10-CM | POA: Diagnosis not present

## 2023-12-31 DIAGNOSIS — Z85828 Personal history of other malignant neoplasm of skin: Secondary | ICD-10-CM | POA: Diagnosis not present

## 2023-12-31 DIAGNOSIS — D485 Neoplasm of uncertain behavior of skin: Secondary | ICD-10-CM | POA: Diagnosis not present

## 2023-12-31 DIAGNOSIS — L7211 Pilar cyst: Secondary | ICD-10-CM | POA: Diagnosis not present

## 2023-12-31 DIAGNOSIS — L814 Other melanin hyperpigmentation: Secondary | ICD-10-CM | POA: Diagnosis not present

## 2023-12-31 DIAGNOSIS — L570 Actinic keratosis: Secondary | ICD-10-CM | POA: Diagnosis not present

## 2023-12-31 DIAGNOSIS — L820 Inflamed seborrheic keratosis: Secondary | ICD-10-CM | POA: Diagnosis not present

## 2023-12-31 DIAGNOSIS — L111 Transient acantholytic dermatosis [Grover]: Secondary | ICD-10-CM | POA: Diagnosis not present

## 2023-12-31 DIAGNOSIS — D0471 Carcinoma in situ of skin of right lower limb, including hip: Secondary | ICD-10-CM | POA: Diagnosis not present

## 2024-05-30 ENCOUNTER — Other Ambulatory Visit: Payer: Self-pay | Admitting: Family Medicine

## 2024-05-30 DIAGNOSIS — Z1231 Encounter for screening mammogram for malignant neoplasm of breast: Secondary | ICD-10-CM

## 2024-06-13 DIAGNOSIS — Z6823 Body mass index (BMI) 23.0-23.9, adult: Secondary | ICD-10-CM | POA: Diagnosis not present

## 2024-06-13 DIAGNOSIS — Z131 Encounter for screening for diabetes mellitus: Secondary | ICD-10-CM | POA: Diagnosis not present

## 2024-06-13 DIAGNOSIS — Z0000 Encounter for general adult medical examination without abnormal findings: Secondary | ICD-10-CM | POA: Diagnosis not present

## 2024-06-13 DIAGNOSIS — Z1331 Encounter for screening for depression: Secondary | ICD-10-CM | POA: Diagnosis not present

## 2024-06-13 DIAGNOSIS — M5432 Sciatica, left side: Secondary | ICD-10-CM | POA: Diagnosis not present

## 2024-06-13 DIAGNOSIS — Z79899 Other long term (current) drug therapy: Secondary | ICD-10-CM | POA: Diagnosis not present

## 2024-06-13 DIAGNOSIS — I10 Essential (primary) hypertension: Secondary | ICD-10-CM | POA: Diagnosis not present

## 2024-06-13 DIAGNOSIS — Z1339 Encounter for screening examination for other mental health and behavioral disorders: Secondary | ICD-10-CM | POA: Diagnosis not present

## 2024-06-13 DIAGNOSIS — E782 Mixed hyperlipidemia: Secondary | ICD-10-CM | POA: Diagnosis not present

## 2024-06-13 DIAGNOSIS — M7051 Other bursitis of knee, right knee: Secondary | ICD-10-CM | POA: Diagnosis not present

## 2024-06-13 DIAGNOSIS — E039 Hypothyroidism, unspecified: Secondary | ICD-10-CM | POA: Diagnosis not present

## 2024-06-15 ENCOUNTER — Ambulatory Visit
Admission: RE | Admit: 2024-06-15 | Discharge: 2024-06-15 | Disposition: A | Discharge status: Home / Self Care | Source: Ambulatory Visit | Attending: Family Medicine | Admitting: Family Medicine

## 2024-06-15 DIAGNOSIS — Z1231 Encounter for screening mammogram for malignant neoplasm of breast: Secondary | ICD-10-CM | POA: Diagnosis not present

## 2024-06-20 DIAGNOSIS — M25561 Pain in right knee: Secondary | ICD-10-CM | POA: Diagnosis not present

## 2024-06-20 DIAGNOSIS — M7051 Other bursitis of knee, right knee: Secondary | ICD-10-CM | POA: Diagnosis not present

## 2024-06-20 DIAGNOSIS — Z6824 Body mass index (BMI) 24.0-24.9, adult: Secondary | ICD-10-CM | POA: Diagnosis not present

## 2024-07-06 DIAGNOSIS — D485 Neoplasm of uncertain behavior of skin: Secondary | ICD-10-CM | POA: Diagnosis not present

## 2024-07-06 DIAGNOSIS — Z85828 Personal history of other malignant neoplasm of skin: Secondary | ICD-10-CM | POA: Diagnosis not present

## 2024-07-06 DIAGNOSIS — D1801 Hemangioma of skin and subcutaneous tissue: Secondary | ICD-10-CM | POA: Diagnosis not present

## 2024-07-06 DIAGNOSIS — L738 Other specified follicular disorders: Secondary | ICD-10-CM | POA: Diagnosis not present

## 2024-07-06 DIAGNOSIS — L570 Actinic keratosis: Secondary | ICD-10-CM | POA: Diagnosis not present

## 2024-07-06 DIAGNOSIS — L720 Epidermal cyst: Secondary | ICD-10-CM | POA: Diagnosis not present

## 2024-07-06 DIAGNOSIS — L7211 Pilar cyst: Secondary | ICD-10-CM | POA: Diagnosis not present

## 2024-07-17 DIAGNOSIS — M25561 Pain in right knee: Secondary | ICD-10-CM | POA: Diagnosis not present

## 2024-07-17 DIAGNOSIS — Z6824 Body mass index (BMI) 24.0-24.9, adult: Secondary | ICD-10-CM | POA: Diagnosis not present

## 2024-07-17 DIAGNOSIS — M7051 Other bursitis of knee, right knee: Secondary | ICD-10-CM | POA: Diagnosis not present

## 2024-07-25 DIAGNOSIS — E039 Hypothyroidism, unspecified: Secondary | ICD-10-CM | POA: Diagnosis not present

## 2024-09-12 DIAGNOSIS — E039 Hypothyroidism, unspecified: Secondary | ICD-10-CM | POA: Diagnosis not present

## 2024-10-23 ENCOUNTER — Other Ambulatory Visit: Payer: Self-pay

## 2024-10-23 MED ORDER — PANTOPRAZOLE SODIUM 40 MG PO TBEC: 40.0000 mg | DELAYED_RELEASE_TABLET | Freq: Every day | ORAL | 2 refills | Status: AC
